# Patient Record
Sex: Male | Born: 1945 | Race: White | Hispanic: No | Marital: Married | Smoking: Never smoker
Health system: Southern US, Academic
[De-identification: ages and names within clinical notes are randomized; demographics above are authoritative.]

## PROBLEM LIST (undated history)

## (undated) DIAGNOSIS — N400 Enlarged prostate without lower urinary tract symptoms: Secondary | ICD-10-CM

## (undated) DIAGNOSIS — Z86718 Personal history of other venous thrombosis and embolism: Secondary | ICD-10-CM

## (undated) DIAGNOSIS — E039 Hypothyroidism, unspecified: Secondary | ICD-10-CM

## (undated) DIAGNOSIS — E782 Mixed hyperlipidemia: Secondary | ICD-10-CM

## (undated) DIAGNOSIS — E119 Type 2 diabetes mellitus without complications: Secondary | ICD-10-CM

## (undated) DIAGNOSIS — R413 Other amnesia: Secondary | ICD-10-CM

## (undated) DIAGNOSIS — K219 Gastro-esophageal reflux disease without esophagitis: Secondary | ICD-10-CM

## (undated) HISTORY — PX: HX HERNIA REPAIR: SHX51

## (undated) HISTORY — PX: HX GALL BLADDER SURGERY/CHOLE: SHX55

---

## 1991-02-04 ENCOUNTER — Inpatient Hospital Stay (HOSPITAL_COMMUNITY): Payer: Self-pay

## 2021-11-28 ENCOUNTER — Encounter (HOSPITAL_COMMUNITY): Payer: Self-pay

## 2021-11-28 ENCOUNTER — Emergency Department (HOSPITAL_COMMUNITY): Payer: 59

## 2021-11-28 ENCOUNTER — Other Ambulatory Visit: Payer: Self-pay

## 2021-11-28 ENCOUNTER — Emergency Department
Admission: EM | Admit: 2021-11-28 | Discharge: 2021-11-28 | Disposition: A | Discharge status: Home / Self Care | Payer: 59 | Attending: Emergency Medicine | Admitting: Emergency Medicine

## 2021-11-28 DIAGNOSIS — Z7901 Long term (current) use of anticoagulants: Secondary | ICD-10-CM | POA: Insufficient documentation

## 2021-11-28 DIAGNOSIS — J189 Pneumonia, unspecified organism: Secondary | ICD-10-CM | POA: Insufficient documentation

## 2021-11-28 HISTORY — DX: Personal history of other venous thrombosis and embolism: Z86.718

## 2021-11-28 HISTORY — DX: Mixed hyperlipidemia: E78.2

## 2021-11-28 HISTORY — DX: Hypothyroidism, unspecified: E03.9

## 2021-11-28 HISTORY — DX: Gastro-esophageal reflux disease without esophagitis: K21.9

## 2021-11-28 HISTORY — DX: Other amnesia: R41.3

## 2021-11-28 HISTORY — DX: Type 2 diabetes mellitus without complications: E11.9

## 2021-11-28 HISTORY — DX: Benign prostatic hyperplasia without lower urinary tract symptoms: N40.0

## 2021-11-28 LAB — URINALYSIS, MACROSCOPIC
BILIRUBIN: NEGATIVE mg/dL
BLOOD: 0.03 mg/dL
GLUCOSE: >1000 mg/dL — AB
KETONES: 100 mg/dL — AB
LEUKOCYTES: NEGATIVE WBCs/uL
NITRITE: NEGATIVE
PH: 5.5 (ref 5.0–9.0)
PROTEIN: 20 mg/dL
SPECIFIC GRAVITY: 1.043 — ABNORMAL HIGH (ref 1.002–1.030)
UROBILINOGEN: NORMAL mg/dL

## 2021-11-28 LAB — CBC WITH DIFF
BASOPHIL #: 0.1 10*3/uL (ref 0.00–0.30)
BASOPHIL %: 1 % (ref 0–3)
EOSINOPHIL #: 0 10*3/uL (ref 0.00–0.80)
EOSINOPHIL %: 0 % (ref 0–7)
HCT: 39.9 % — ABNORMAL LOW (ref 42.0–51.0)
HGB: 13.7 g/dL (ref 13.5–18.0)
LYMPHOCYTE #: 0.9 10*3/uL — ABNORMAL LOW (ref 1.10–5.00)
LYMPHOCYTE %: 8 % — ABNORMAL LOW (ref 25–45)
MCH: 31 pg (ref 27.0–32.0)
MCHC: 34.4 g/dL (ref 32.0–36.0)
MCV: 89.9 fL (ref 78.0–99.0)
MONOCYTE #: 0.9 10*3/uL (ref 0.00–1.30)
MONOCYTE %: 8 % (ref 0–12)
MPV: 7.8 fL (ref 7.4–10.4)
NEUTROPHIL #: 9.6 10*3/uL — ABNORMAL HIGH (ref 1.80–8.40)
NEUTROPHIL %: 84 % — ABNORMAL HIGH (ref 40–76)
PLATELETS: 189 10*3/uL (ref 140–440)
RBC: 4.44 10*6/uL (ref 4.20–6.00)
RDW: 13.9 % (ref 11.6–14.8)
WBC: 11.5 10*3/uL — ABNORMAL HIGH (ref 4.0–10.5)
WBCS UNCORRECTED: 11.5 10*3/uL

## 2021-11-28 LAB — COMPREHENSIVE METABOLIC PANEL, NON-FASTING
ALBUMIN/GLOBULIN RATIO: 1.2 (ref 0.8–1.4)
ALBUMIN: 4 g/dL (ref 3.5–5.7)
ALKALINE PHOSPHATASE: 60 U/L (ref 34–104)
ALT (SGPT): 9 U/L (ref 7–52)
ANION GAP: 13 mmol/L (ref 10–20)
AST (SGOT): 13 U/L (ref 13–39)
BILIRUBIN TOTAL: 1.6 mg/dL — ABNORMAL HIGH (ref 0.3–1.2)
BUN/CREA RATIO: 16 (ref 6–22)
BUN: 17 mg/dL (ref 7–25)
CALCIUM, CORRECTED: 8.7 mg/dL — ABNORMAL LOW (ref 8.9–10.8)
CALCIUM: 8.7 mg/dL (ref 8.6–10.3)
CHLORIDE: 99 mmol/L (ref 98–107)
CO2 TOTAL: 23 mmol/L (ref 21–31)
CREATININE: 1.04 mg/dL (ref 0.60–1.30)
ESTIMATED GFR: 76 mL/min/{1.73_m2} (ref >59)
GLOBULIN: 3.3 (ref 2.9–5.4)
GLUCOSE: 162 mg/dL — ABNORMAL HIGH (ref 74–109)
OSMOLALITY, CALCULATED: 275 mOsm/kg (ref 270–290)
POTASSIUM: 3.7 mmol/L (ref 3.5–5.1)
PROTEIN TOTAL: 7.3 g/dL (ref 6.4–8.9)
SODIUM: 135 mmol/L — ABNORMAL LOW (ref 136–145)

## 2021-11-28 LAB — URINALYSIS, MICROSCOPIC
RBCS: 1 /hpf (ref <4)
WBCS: <1 /hpf (ref <6)

## 2021-11-28 LAB — BLUE TOP TUBE

## 2021-11-28 MED ORDER — DOXYCYCLINE HYCLATE 100 MG TABLET
100.0000 mg | ORAL_TABLET | ORAL | Status: AC
Start: 2021-11-28 — End: 2021-11-28
  Administered 2021-11-28: 100 mg via ORAL

## 2021-11-28 MED ORDER — LIDOCAINE HCL 10 MG/ML (1 %) INJECTION SOLUTION
1.0000 g | INTRAMUSCULAR | Status: AC
Start: 2021-11-28 — End: 2021-11-28
  Administered 2021-11-28: 1 g via INTRAMUSCULAR

## 2021-11-28 MED ORDER — DOXYCYCLINE HYCLATE 100 MG TABLET
ORAL_TABLET | ORAL | Status: AC
Start: 2021-11-28 — End: 2021-11-28
  Filled 2021-11-28: qty 1

## 2021-11-28 MED ORDER — SODIUM CHLORIDE 0.9 % INTRAVENOUS PIGGYBACK
1.0000 g | INTRAVENOUS | Status: DC
Start: 2021-11-28 — End: 2021-11-28

## 2021-11-28 MED ORDER — LIDOCAINE HCL 10 MG/ML (1 %) INJECTION SOLUTION
INTRAMUSCULAR | Status: AC
Start: 2021-11-28 — End: 2021-11-28
  Filled 2021-11-28: qty 50

## 2021-11-28 MED ORDER — AMOXICILLIN 875 MG-POTASSIUM CLAVULANATE 125 MG TABLET
1.0000 | ORAL_TABLET | Freq: Two times a day (BID) | ORAL | 0 refills | Status: AC
Start: 2021-11-28 — End: 2021-12-05

## 2021-11-28 MED ORDER — CEFTRIAXONE 1 GRAM SOLUTION FOR INJECTION
INTRAMUSCULAR | Status: AC
Start: 2021-11-28 — End: 2021-11-28
  Filled 2021-11-28: qty 10

## 2021-11-28 MED ORDER — DOXYCYCLINE HYCLATE 100 MG CAPSULE
100.0000 mg | ORAL_CAPSULE | Freq: Two times a day (BID) | ORAL | 0 refills | Status: AC
Start: 2021-11-28 — End: 2021-12-05

## 2021-11-28 NOTE — ED Triage Notes (Signed)
States since Monday he has been having intermittent confusion and trouble with memory. Headache since yesterday.

## 2021-11-28 NOTE — Discharge Instructions (Signed)

## 2021-11-28 NOTE — ED Provider Notes (Signed)
[facility]  ED Primary Provider Note  Patient Name: Luke Thomas  Patient Age: 76 y.o.  Date of Birth: 1945/08/24    Chief Complaint: Confusion and Headache        History of Present Illness       Luke Thomas is a 76 y.o. male who had concerns including Confusion and Headache.  This patient is a 76 year old male who presents with memory related problems.  The patient hit his head multiple days prior, and this started in the days following hitting his head.  He self endorses having difficulty remembering.  He does take Xarelto.        Review of Systems     No other overt Review of Systems are noted to be positive except noted in the HPI.      Historical Data   History Reviewed This Encounter:        Physical Exam   ED Triage Vitals [11/28/21 1126]   BP (Non-Invasive) (!) 141/85   Heart Rate 92   Respiratory Rate 17   Temperature 36.7 C (98 F)   SpO2 97 %   Weight 110 kg (243 lb)   Height 1.88 m ('6\' 2"' )         Nursing notes reviewed for what could be assessed. Past Medical, Surgical, and Social history reviewed. Exam limited in the setting of personal protective equipment.    Constitutional: NAD. Well-Developed. Well Nourished.  Head: Normocephalic, atraumatic.  Mouth/Throat:  Symmetric facial movement  Eyes: EOM grossly intact, conjunctiva normal.  Neck: Supple  Cardiovascular: Extremities well perfused.  Pulmonary/Chest: No respiratory distress.  No stridor/abnormal respiratory sounds heard.  Abdominal: Non-distended. No overt peritoneal findings.   MSK: No Lower Extremity Edema.  Skin: Warm, dry, and intact for what is visualized.   Neuro: Appropriate, CN II-XII grossly intact. Gait not ataxic.  Psych: Pleasant            Procedures      Patient Data     Labs Ordered/Reviewed   COMPREHENSIVE METABOLIC PANEL, NON-FASTING - Abnormal; Notable for the following components:       Result Value    SODIUM 135 (*)     GLUCOSE 162 (*)     BILIRUBIN TOTAL 1.6 (*)     CALCIUM,  CORRECTED 8.7 (*)     All other components within normal limits    Narrative:     Estimated Glomerular Filtration Rate (eGFR) is calculated using the CKD-EPI (2021) equation, intended for patients 43 years of age and older. If gender is not documented or "unknown", there will be no eGFR calculation.   URINALYSIS, MACROSCOPIC - Abnormal; Notable for the following components:    SPECIFIC GRAVITY 1.043 (*)     GLUCOSE >1000 (*)     KETONES 100 (*)     All other components within normal limits   CBC WITH DIFF - Abnormal; Notable for the following components:    WBC 11.5 (*)     HCT 39.9 (*)     NEUTROPHIL % 84 (*)     LYMPHOCYTE % 8 (*)     NEUTROPHIL # 9.60 (*)     LYMPHOCYTE # 0.90 (*)     All other components within normal limits   URINALYSIS, MICROSCOPIC - Normal   URINALYSIS, MACROSCOPIC AND MICROSCOPIC W/CULTURE REFLEX    Narrative:     The following orders were created for panel order URINALYSIS, MACROSCOPIC AND MICROSCOPIC W/CULTURE REFLEX.  Procedure  Abnormality         Status                     ---------                               -----------         ------                     URINALYSIS, MACROSCOPIC[[id number]]      Abnormal            Final result               URINALYSIS, MICROSCOPIC[[id number]]      Normal              Final result                 Please view results for these tests on the individual orders.   CBC/DIFF    Narrative:     The following orders were created for panel order CBC/DIFF.  Procedure                               Abnormality         Status                     ---------                               -----------         ------                     CBC WITH DIFF[[id number]]                Abnormal            Final result                 Please view results for these tests on the individual orders.   EXTRA TUBES    Narrative:     The following orders were created for panel order EXTRA TUBES.  Procedure                               Abnormality         Status                      ---------                               -----------         ------                     BLUE TOP TUBE[[id number]]                                    Final result                 Please view results for these tests on the individual orders.   BLUE TOP TUBE       XR  CHEST AP AND LATERAL   Final Result by Edi, Radresults In (06/21 1315)   Retrocardiac infiltrate         Radiologist location ID: [id number]         CT BRAIN WO IV CONTRAST   Final Result by Edi, Radresults In (06/21 1215)   NO ACUTE FINDINGS         One or more dose reduction techniques were used (e.g., Automated exposure control, adjustment of the mA and/or kV according to patient size, use of iterative reconstruction technique).         Radiologist location ID: [place] Decision Making          MDM      Studies Assessed:  Lab, radiology      MDM Narrative:  This patient is a 76 year old male who presents with reported memory related problems.  Differential includes intracranial abnormality, infectious etiology, metabolic abnormality.  After workup, the patient was found to have a retrocardiac opacity which can be consistent with pneumonia.  He was given ceftriaxone and doxycycline in the emergency department.  His wife can watch over him, and is in the healthcare field.  They feel safe to be discharged home at this point in time.  Return precautions given.             Medications Administered in the ED   doxycycline tablet (100 mg Oral Given 11/28/21 1402)   cefTRIAXone (ROCEPHIN) 1 g in lidocaine 2.86 mL (tot vol) IM injection (1 g IntraMUSCULAR Given 11/28/21 1401)       Following the history, physical exam, and ED workup, the patient was deemed stable and suitable for discharge. The patient/caregiver was advised to return to the ED for any new or worsening symptoms. Discharge medications, and follow-up instructions were discussed with the patient/caregiver in detail, who verbalizes understanding. The  patient/caregiver is in agreement and is comfortable with the plan of care.    Disposition: Discharged         Current Discharge Medication List      START taking these medications.      Details   amoxicillin-pot clavulanate 875-125 mg Tablet  Commonly known as: AUGMENTIN   1 Tablet, Oral, EVERY 12 HOURS  Qty: 14 Tablet  Refills: 0     doxycycline hyclate 100 mg Capsule  Commonly known as: VIBRAMYCIN   100 mg, Oral, EVERY 12 HOURS  Qty: 14 Capsule  Refills: 0        CONTINUE these medications - NO CHANGES were made during your visit.      Details   donepeziL 5 mg Tablet  Commonly known as: ARICEPT   5 mg, Oral, NIGHTLY  Refills: 0     Farxiga 10 mg Tablet  Generic drug: dapagliflozin propanediol   10 mg, Oral, DAILY  Refills: 0     gabapentin 600 mg Tablet  Commonly known as: NEURONTIN   600 mg, Oral, 3 TIMES DAILY  Refills: 0     levothyroxine 50 mcg Tablet  Commonly known as: SYNTHROID   50 mcg, Oral, EVERY MORNING  Refills: 0     pantoprazole 40 mg Tablet, Delayed Release (E.C.)  Commonly known as: PROTONIX   40 mg, Oral, NIGHTLY  Refills: 0     rivaroxaban 20 mg Tablet  Commonly known as: XARELTO   20 mg, Oral, EVERY MORNING  Refills: 0     rosuvastatin 10 mg Tablet  Commonly known as: CRESTOR   10 mg, Oral, EVERY MORNING  Refills: 0     Rybelsus 7 mg Tablet  Generic drug: semaglutide   7 mg, Oral, DAILY  Refills: 0     tamsulosin 0.4 mg Capsule  Commonly known as: FLOMAX   0.4 mg, Oral, NIGHTLY  Refills: 0          Follow up:   Stroh Haste, MD  [address]  [place] [address]  [phone]    Call today                 Clinical Impression   Pneumonia (Primary)         Current Discharge Medication List      START taking these medications    Details   amoxicillin-pot clavulanate (AUGMENTIN) 875-125 mg Oral Tablet Take 1 Tablet by mouth Every 12 hours for 7 days  Qty: 14 Tablet, Refills: 0      doxycycline hyclate (VIBRAMYCIN) 100 mg Oral Capsule Take 1 Capsule (100 mg total) by mouth Every 12 hours for 7  days  Qty: 14 Capsule, Refills: 0               R. Baldo Daub, MD, [facility]  Department of Emergency Medicine

## 2021-11-28 NOTE — ED Nurses Note (Signed)
Patient discharged home with family.  AVS reviewed with patient/care giver.  A written copy of the AVS and discharge instructions was given to the patient/care giver.  Questions sufficiently answered as needed.  Patient/care giver encouraged to follow up with PCP as indicated.  In the event of an emergency, patient/care giver instructed to call 911 or go to the nearest emergency room.

## 2021-12-20 IMAGING — MR MRI BRAIN W/O CONTRAST
9 series · 48 of 48 positions shown · IV contrast (gadolinium)
Comparison: None available.

﻿EXAM:  MRI BRAIN W/O CONTRAST
INDICATION: Headaches.
TECHNIQUE: Multiplanar, multisequential MRI of the brain was performed without gadolinium contrast.

[Series 5: DWI · axial · 5.0mm · 1.35mm/px · z∈[-60,+66]mm · 16 of 88 slices shown (1 of 3)]
[im 1/88]
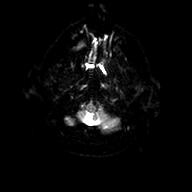
[im 6/88]
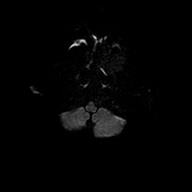
[im 12/88]
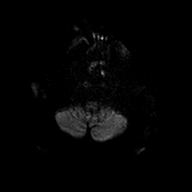
[im 18/88]
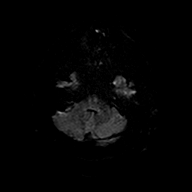
[im 24/88]
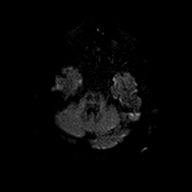
[im 30/88]
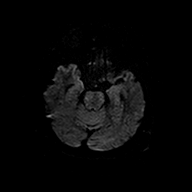
[im 35/88]
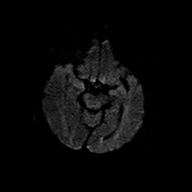
[im 41/88]
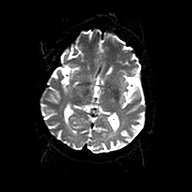
[im 47/88]
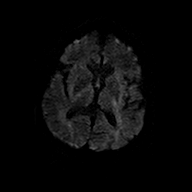
[im 53/88]
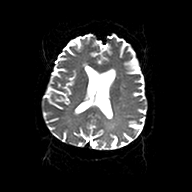
[im 59/88]
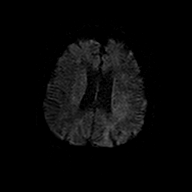
[im 64/88]
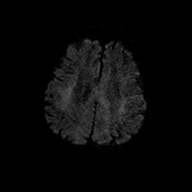
[im 70/88]
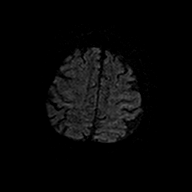
[im 76/88]
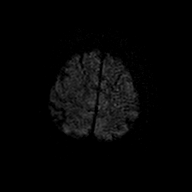
[im 82/88]
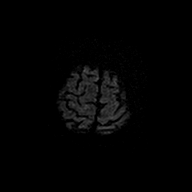
[im 88/88]
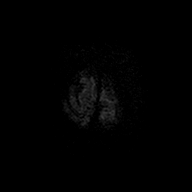

[Series 6: DWI · axial · 5.0mm · 1.35mm/px · z∈[-60,+66]mm · 4 of 22 slices shown (2 of 3)]
[im 1/22]
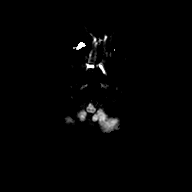
[im 8/22]
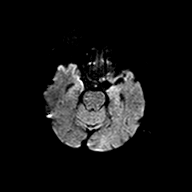
[im 15/22]
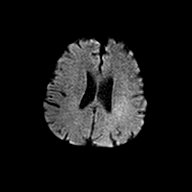
[im 22/22]
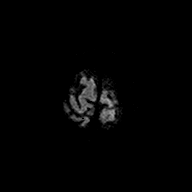

[Series 7: DWI · axial · 5.0mm · 1.35mm/px · z∈[-60,+66]mm · 4 of 22 slices shown (3 of 3)]
[im 1/22]
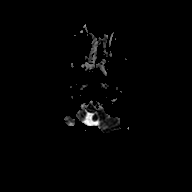
[im 8/22]
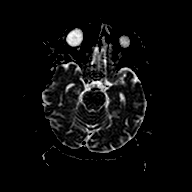
[im 15/22]
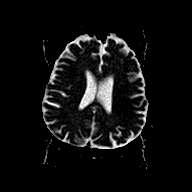
[im 22/22]
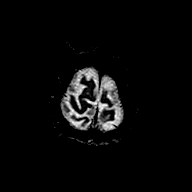

[Series 8: FLAIR · sagittal · 4.0mm · 0.75mm/px · 4 of 26 slices shown (1 of 2)]
[im 1/26]
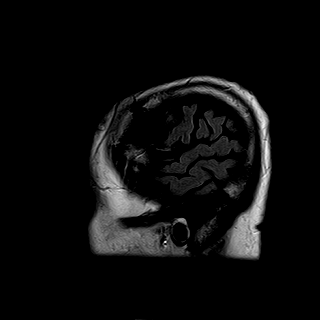
[im 9/26]
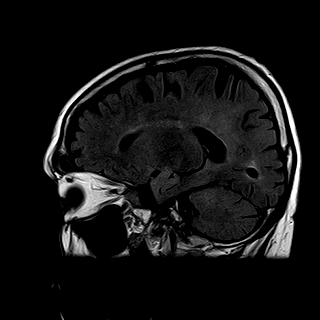
[im 17/26]
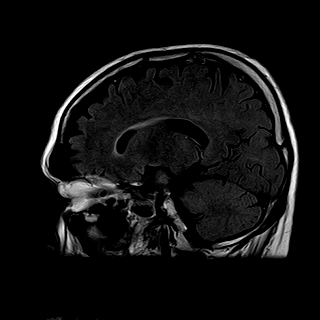
[im 26/26]
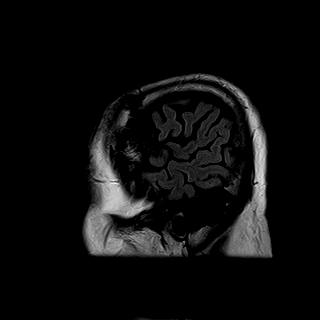

[Series 9: T2 · axial · 5.0mm · 0.43mm/px · z∈[-69,+75]mm · 4 of 25 slices shown (1 of 2)]
[im 1/25]
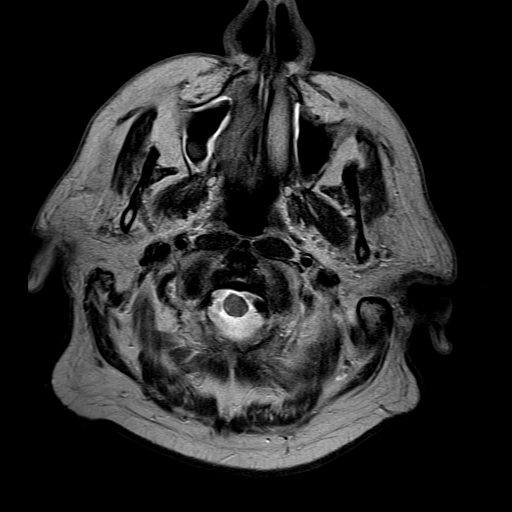
[im 9/25]
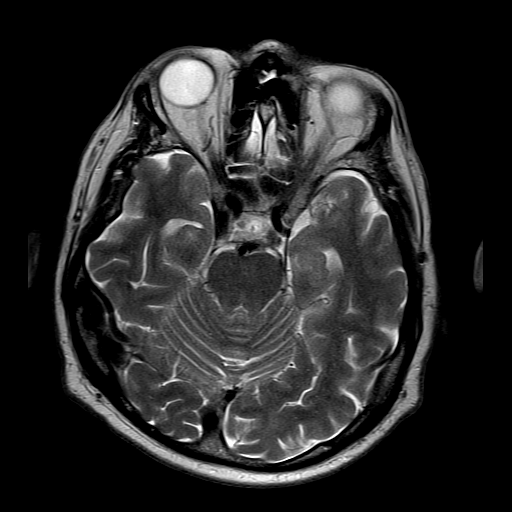
[im 17/25]
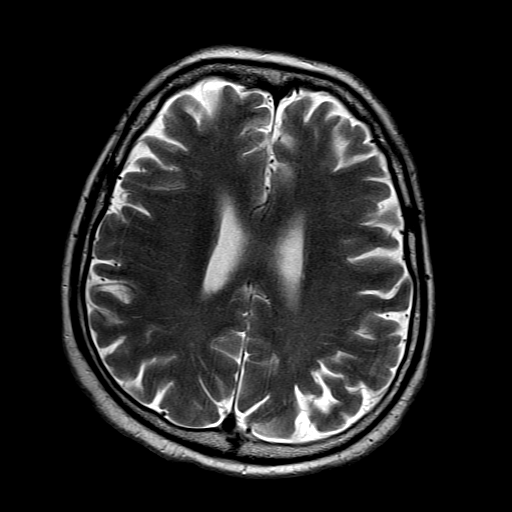
[im 25/25]
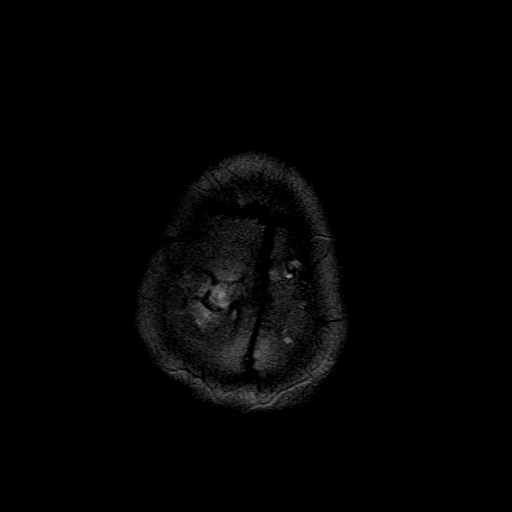

[Series 10: FLAIR · axial · 5.0mm · 0.76mm/px · z∈[-60,+66]mm · 4 of 22 slices shown (2 of 2)]
[im 1/22]
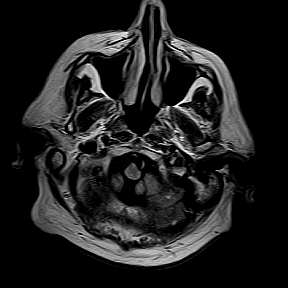
[im 8/22]
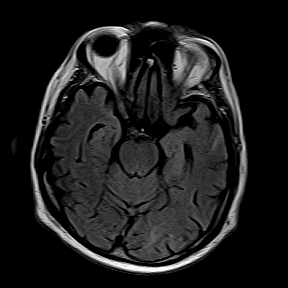
[im 15/22]
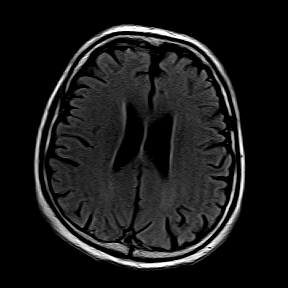
[im 22/22]
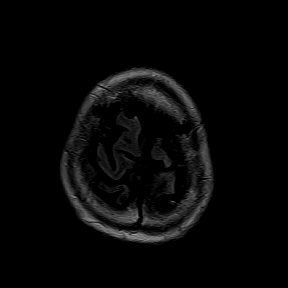

[Series 11: T1 · axial · 5.0mm · 0.69mm/px · z∈[-69,+75]mm · 4 of 25 slices shown]
[im 1/25]
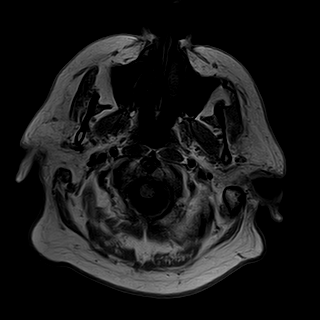
[im 9/25]
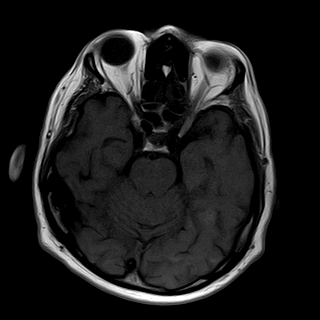
[im 17/25]
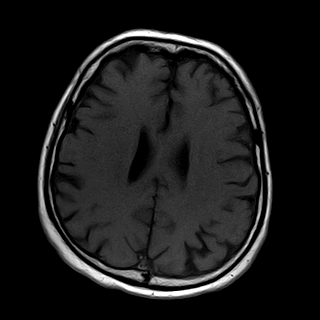
[im 25/25]
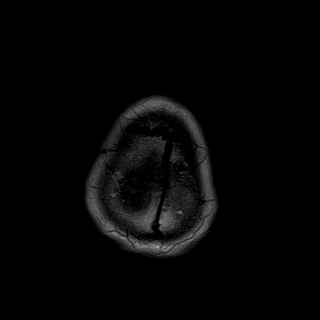

[Series 12: T2-star · axial · 5.0mm · 0.69mm/px · z∈[-69,+75]mm · 4 of 25 slices shown]
[im 1/25]
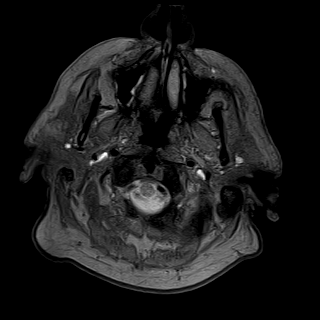
[im 9/25]
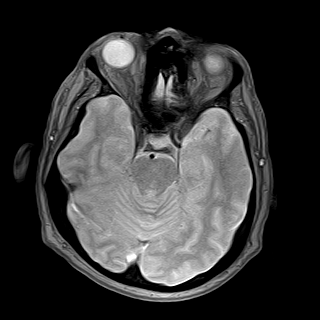
[im 17/25]
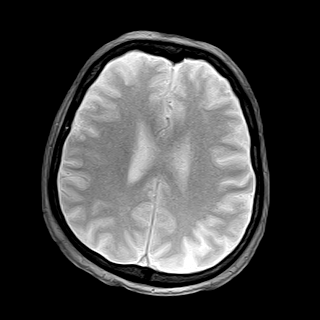
[im 25/25]
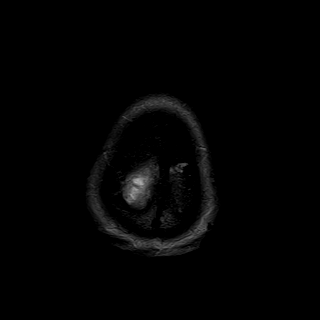

[Series 13: T2 · coronal · 6.0mm · 0.43mm/px · 4 of 24 slices shown (2 of 2)]
[im 1/24]
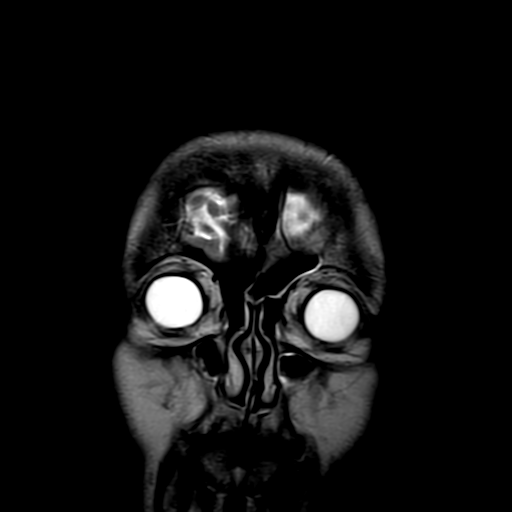
[im 8/24]
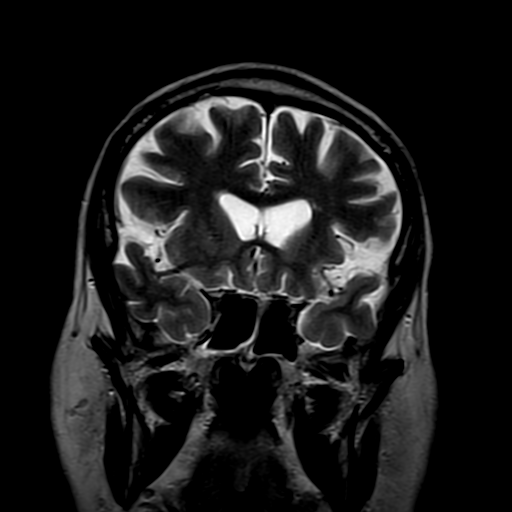
[im 16/24]
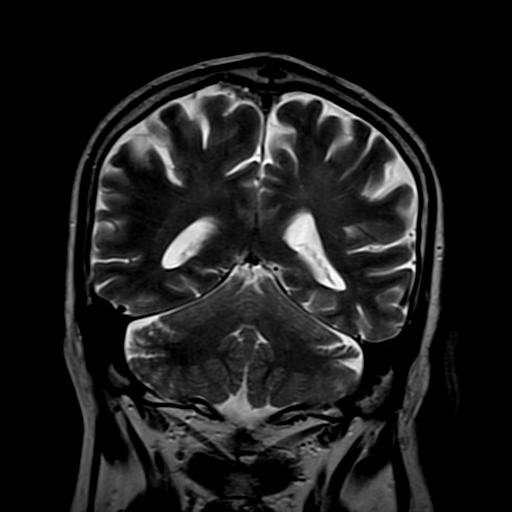
[im 24/24]
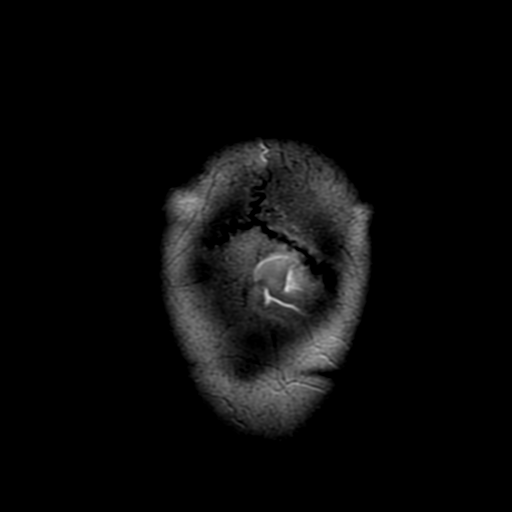

[48 of 48 positions shown; findings below may reference images not displayed]

FINDINGS: Ventricular and sulcal size is normal for the patient's age.  There are no significant chronic ischemic changes.  There is no mass effect, midline shift or intracranial hemorrhage. There is no evidence of acute infarction or prior microhemorrhages. Skull base flow voids and basal cisterns are patent. Sagittal survey of midline structures is unremarkable. There are no extra-axial fluid collections. Visualized paranasal sinuses, mastoid air cells and orbital contents are unremarkable.
IMPRESSION: Unremarkable exam.

## 2022-01-29 IMAGING — MR MRI CERVICAL SPINE WITHOUT CONTRAST
4 of 5 series · 23 of 48 positions shown · IV contrast (gadolinium)
Comparison: MRI head dated 12/20/2021.

﻿EXAM:  [id number]   MRI CERVICAL SPINE WITHOUT CONTRAST
INDICATION: 75-year-old male sustained trauma last month.  Possible cervical myelopathy.
TECHNIQUE: Multiplanar, multisequential MRI of the C-spine was performed without gadolinium contrast.

[Series 5: T2 · sagittal · 3.0mm · 0.75mm/px · 8 of 13 slices shown (1 of 2)]
[im 1/13]
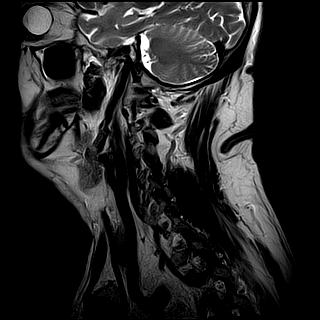
[im 2/13]
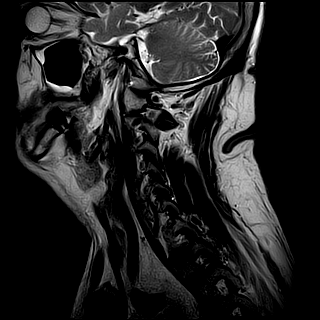
[im 4/13]
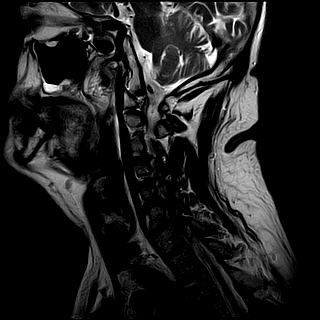
[im 6/13]
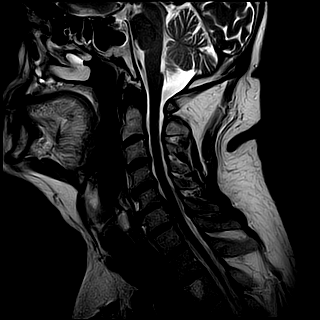
[im 7/13]
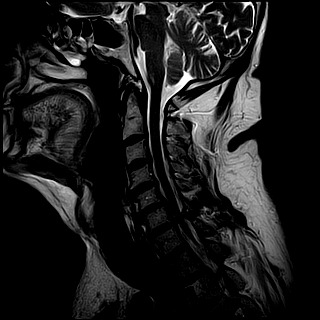
[im 9/13]
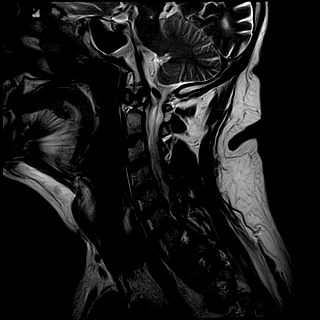
[im 11/13]
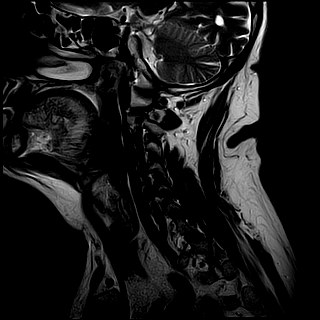
[im 13/13]
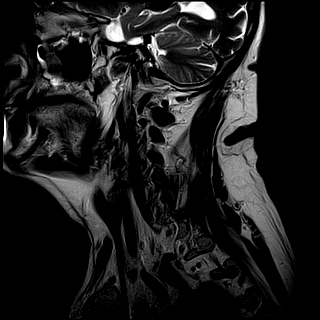

[Series 6: T1 · sagittal · 3.0mm · 0.47mm/px · 3 of 13 slices shown]
[im 2/13]
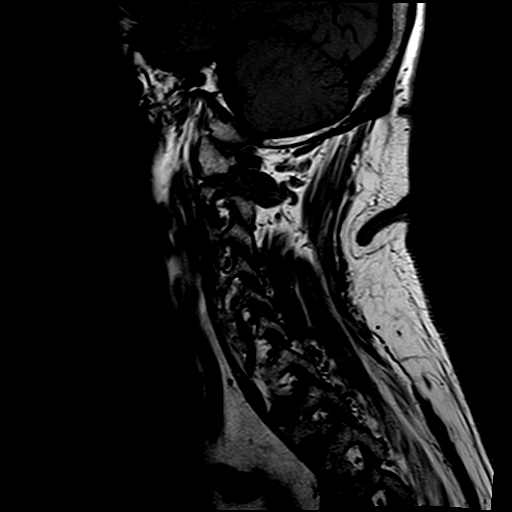
[im 7/13]
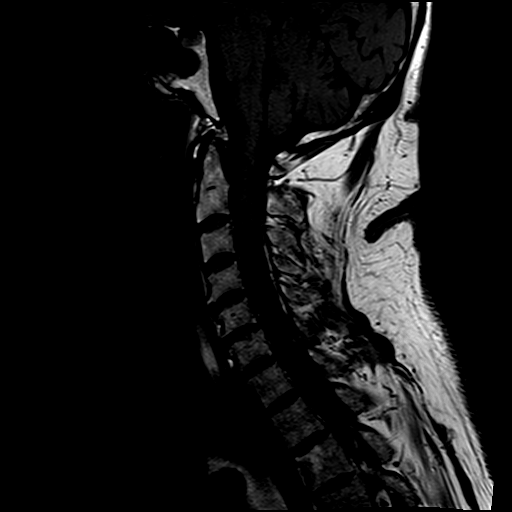
[im 11/13]
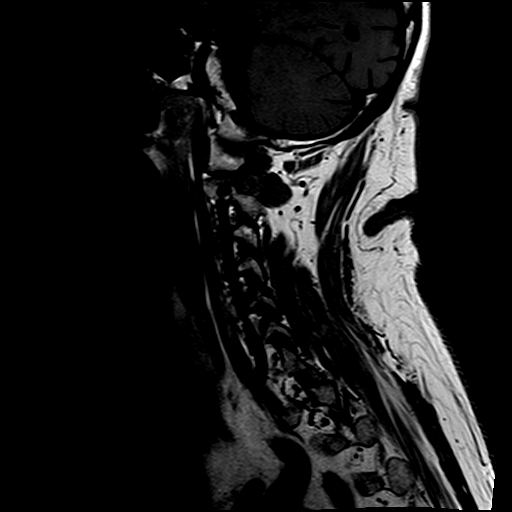

[Series 8: STIR · sagittal · 3.0mm · 0.47mm/px · 3 of 13 slices shown]
[im 2/13]
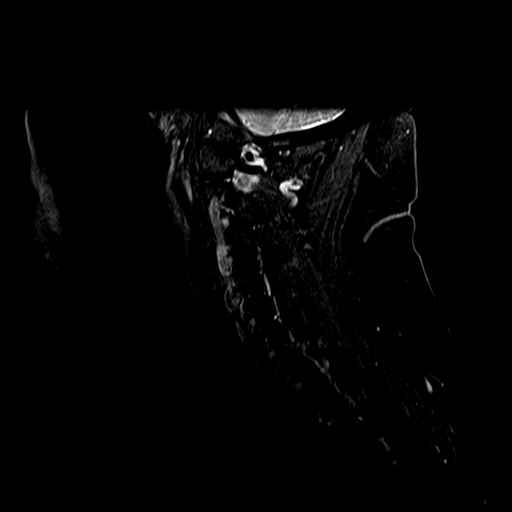
[im 7/13]
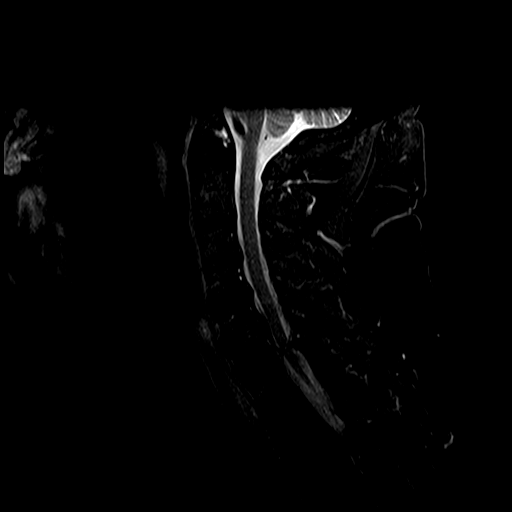
[im 11/13]
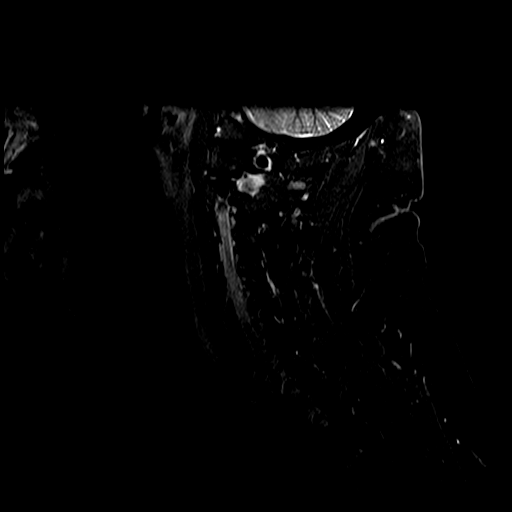

[Series 9: T2 · axial · 3.0mm · 0.39mm/px · z∈[-132,-23]mm · 9 of 18 slices shown (2 of 2)]
[im 1/18]
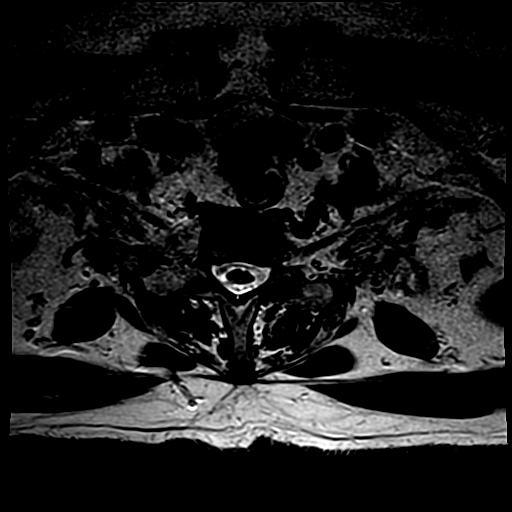
[im 4/18]
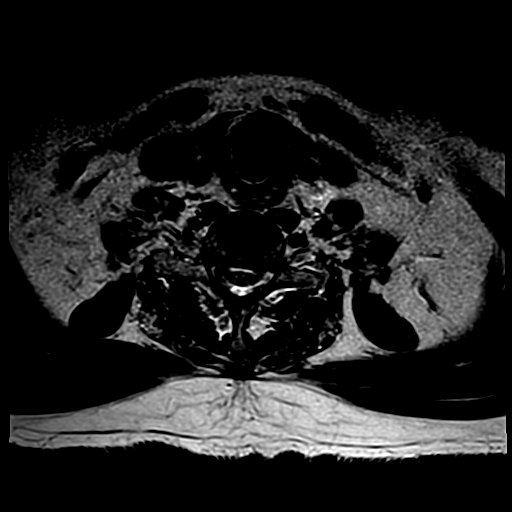
[im 5/18]
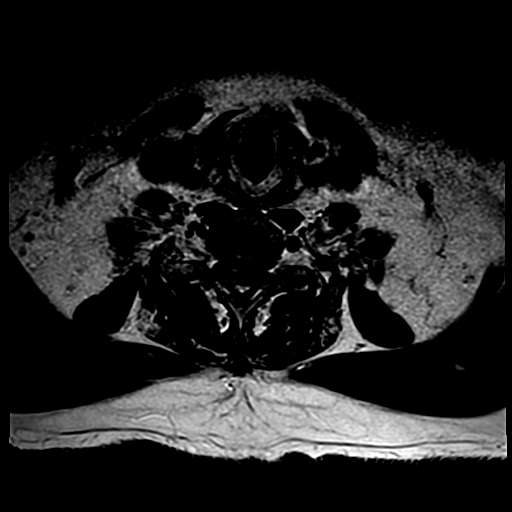
[im 8/18]
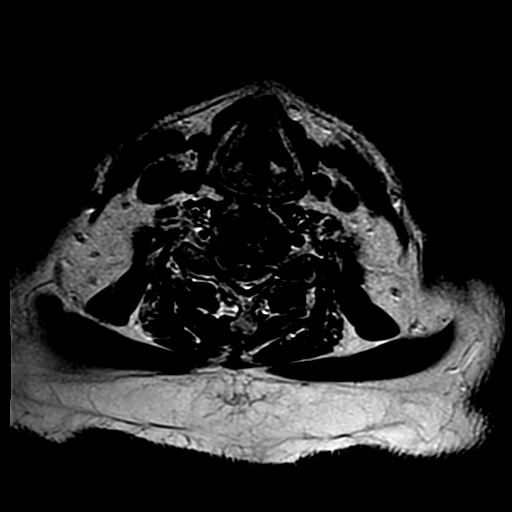
[im 10/18]
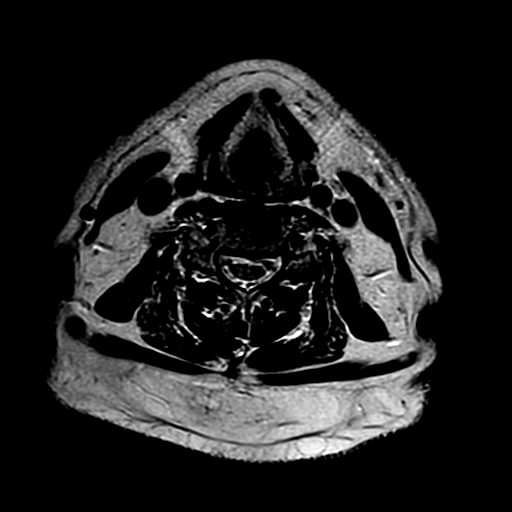
[im 13/18]
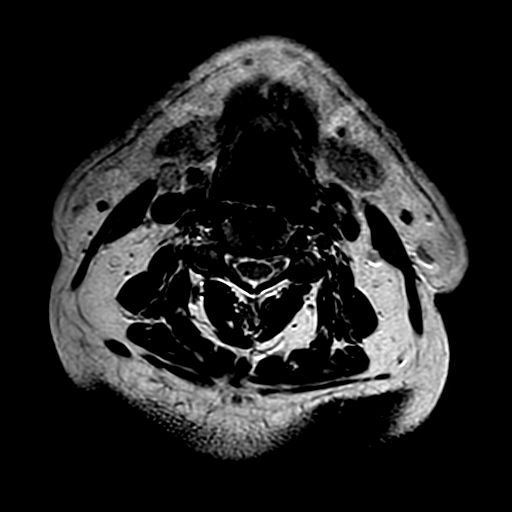
[im 14/18]
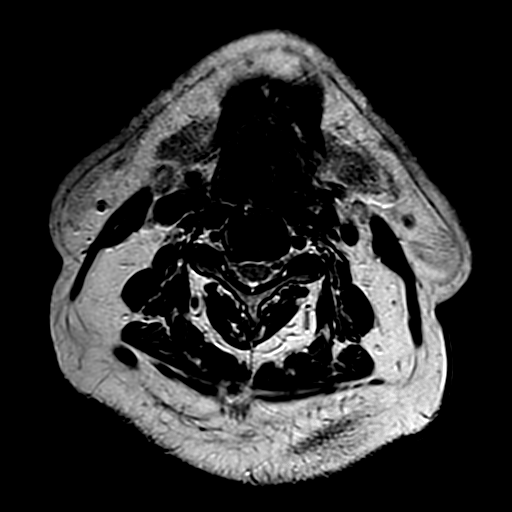
[im 16/18]
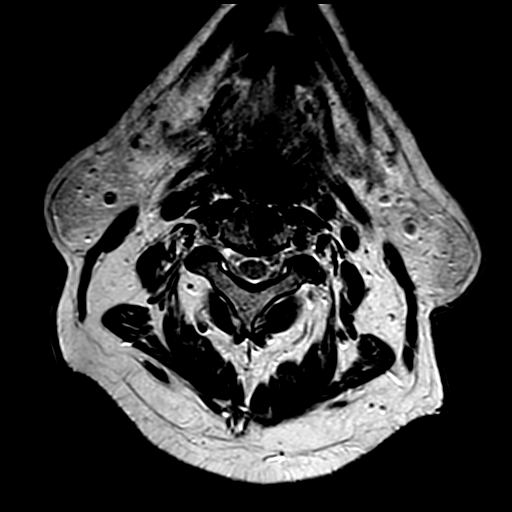
[im 18/18]
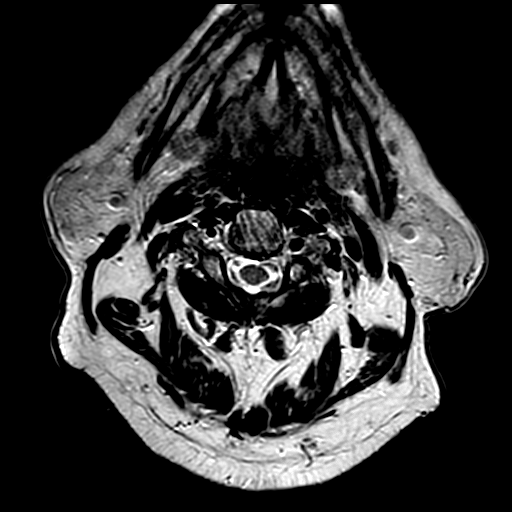

[23 of 48 positions shown; findings below may reference images not displayed]

FINDINGS: No acute bony lesions of cervical vertebrae are seen. 

Posterior fossa and foramen magnum structures do not show any focal abnormalities in the sagittal images. 

At C2-3 level, no focal disc lesions are seen. 

At C3-4 level, degenerative disc disease with osteophyte complex causing mild compromise of right lateral recess and neural foramen.

At C4-5 level, degenerative disc disease causing mild compromise of right neural foramen due to bulging annulus as well as thecal sac in the midline. 

At C5-6 level, degenerative disc disease with bulging annulus is noted causing mild compromise of thecal sac and moderate compromise of both neural foramina, left more than the right. AP diameter of the thecal sac in the midline measures 10 mm.

At C6-7 level, significant degenerative disc disease is noted with hypertrophy of dorsal ligaments. Bulging annulus and hypertrophy of dorsal ligaments are noted.  The annulus bulge is asymmetric to the left with annulus tear on the left side lateral recess.  Significant compromise of left lateral recess and neural foramen are noted at this level. AP diameter of thecal sac in the midline measures 7.3 mm.

C7-T1 disc shows no focal pathology.

Cervical spinal cord shows no focal abnormal signal or syrinx formation, etcetera.
IMPRESSION: 1. No acute bone changes of cervical vertebrae. 

2. At C5-6 level, degenerative disc disease with bulging annulus is noted causing mild compromise of thecal sac and moderate compromise of both neural foramina, left more than the right. AP diameter of the thecal sac in the midline measures 10 mm.

3. At C6-7 level, significant degenerative disc disease is noted with hypertrophy of dorsal ligaments. Bulging annulus and hypertrophy of dorsal ligaments are noted.  The annulus bulge is asymmetric to the left with annulus tear on the left side lateral recess.  Significant compromise of left lateral recess and neural foramen are noted at this level. AP diameter of thecal sac in the midline measures 7.3 mm.

4. No focal signal abnormality or other lesions of cervical spinal cord are seen.

## 2022-09-02 ENCOUNTER — Other Ambulatory Visit (HOSPITAL_COMMUNITY): Payer: Self-pay | Admitting: GENERAL SURGERY

## 2022-09-02 DIAGNOSIS — K862 Cyst of pancreas: Secondary | ICD-10-CM

## 2022-09-10 ENCOUNTER — Ambulatory Visit (HOSPITAL_COMMUNITY): Payer: Self-pay

## 2022-09-10 ENCOUNTER — Other Ambulatory Visit: Payer: Self-pay

## 2022-09-10 ENCOUNTER — Inpatient Hospital Stay (HOSPITAL_COMMUNITY)
Admission: RE | Admit: 2022-09-10 | Discharge: 2022-09-10 | Disposition: A | Discharge status: Home / Self Care | Payer: 59 | Source: Ambulatory Visit | Attending: GENERAL SURGERY | Admitting: GENERAL SURGERY

## 2022-09-10 ENCOUNTER — Inpatient Hospital Stay
Admission: RE | Admit: 2022-09-10 | Discharge: 2022-09-10 | Disposition: A | Discharge status: Home / Self Care | Payer: 59 | Source: Ambulatory Visit | Attending: GENERAL SURGERY | Admitting: GENERAL SURGERY

## 2022-09-10 DIAGNOSIS — K862 Cyst of pancreas: Secondary | ICD-10-CM | POA: Insufficient documentation

## 2022-09-10 MED ORDER — GADOBUTROL 10 MMOL/10 ML (1 MMOL/ML) INTRAVENOUS SOLUTION
10.0000 mL | INTRAVENOUS | Status: AC
Start: 2022-09-10 — End: 2022-09-10
  Administered 2022-09-10: 10 mL via INTRAVENOUS

## 2022-09-21 ENCOUNTER — Other Ambulatory Visit: Payer: Self-pay

## 2022-09-21 ENCOUNTER — Emergency Department
Admission: EM | Admit: 2022-09-21 | Discharge: 2022-09-21 | Disposition: A | Discharge status: Home / Self Care | Payer: 59 | Attending: Physician Assistant | Admitting: Physician Assistant

## 2022-09-21 ENCOUNTER — Encounter (HOSPITAL_COMMUNITY): Payer: Self-pay

## 2022-09-21 DIAGNOSIS — K863 Pseudocyst of pancreas: Secondary | ICD-10-CM | POA: Insufficient documentation

## 2022-09-21 LAB — COMPREHENSIVE METABOLIC PANEL, NON-FASTING
ALBUMIN/GLOBULIN RATIO: 1.4 (ref 0.8–1.4)
ALBUMIN: 4.1 g/dL (ref 3.5–5.7)
ALKALINE PHOSPHATASE: 70 U/L (ref 34–104)
ALT (SGPT): 14 U/L (ref 7–52)
ANION GAP: 8 mmol/L (ref 4–13)
AST (SGOT): 12 U/L — ABNORMAL LOW (ref 13–39)
BILIRUBIN TOTAL: 0.9 mg/dL (ref 0.3–1.2)
BUN/CREA RATIO: 17 (ref 6–22)
BUN: 15 mg/dL (ref 7–25)
CALCIUM, CORRECTED: 8.8 mg/dL — ABNORMAL LOW (ref 8.9–10.8)
CALCIUM: 8.9 mg/dL (ref 8.6–10.3)
CHLORIDE: 102 mmol/L (ref 98–107)
CO2 TOTAL: 28 mmol/L (ref 21–31)
CREATININE: 0.89 mg/dL (ref 0.60–1.30)
ESTIMATED GFR: 90 mL/min/{1.73_m2} (ref >59)
GLOBULIN: 3 (ref 2.9–5.4)
GLUCOSE: 194 mg/dL — ABNORMAL HIGH (ref 74–109)
OSMOLALITY, CALCULATED: 282 mOsm/kg (ref 270–290)
POTASSIUM: 4.1 mmol/L (ref 3.5–5.1)
PROTEIN TOTAL: 7.1 g/dL (ref 6.4–8.9)
SODIUM: 138 mmol/L (ref 136–145)

## 2022-09-21 LAB — LIPASE: LIPASE: 105 U/L — ABNORMAL HIGH (ref 11–82)

## 2022-09-21 LAB — CBC WITH DIFF
BASOPHIL #: 0.1 10*3/uL (ref 0.00–0.10)
BASOPHIL %: 1 % (ref 0–1)
EOSINOPHIL #: 0.2 10*3/uL (ref 0.00–0.50)
EOSINOPHIL %: 3 %
HCT: 38.7 % (ref 36.7–47.1)
HGB: 13.5 g/dL (ref 12.5–16.3)
LYMPHOCYTE #: 1.5 10*3/uL (ref 1.00–3.00)
LYMPHOCYTE %: 20 % (ref 16–44)
MCH: 31.1 pg (ref 23.8–33.4)
MCHC: 34.9 g/dL (ref 32.5–36.3)
MCV: 89.1 fL (ref 73.0–96.2)
MONOCYTE #: 0.6 10*3/uL (ref 0.30–1.00)
MONOCYTE %: 8 % (ref 5–13)
MPV: 7.9 fL (ref 7.4–11.4)
NEUTROPHIL #: 5.3 10*3/uL (ref 1.85–7.80)
NEUTROPHIL %: 69 % (ref 43–77)
PLATELETS: 167 10*3/uL (ref 140–440)
RBC: 4.35 10*6/uL (ref 4.06–5.63)
RDW: 14.1 % (ref 12.1–16.2)
WBC: 7.7 10*3/uL (ref 3.6–10.2)

## 2022-09-21 LAB — BLUE TOP TUBE

## 2022-09-21 LAB — GOLD TOP TUBE

## 2022-09-21 LAB — GRAY TOP TUBE

## 2022-09-21 NOTE — ED Provider Notes (Signed)
Emergency Medicine      Name: Luke Thomas  Age and Gender: 77 y.o. male  Date of Birth: Jan 16, 1946  MRN: [id number]  PCP: Jodell Cipro, MD    CC:  Chief Complaint   Patient presents with    Abnormal Lab Result       HPI:  Luke Thomas is a 77 y.o. White male who presents to the ER with RUQ discomfort and known pancreatic pseudocyst. Patient states he has had prior pseudocyst removed by Dr Theodoro Kalata, and developed discomfort in the RUQ about a month ago. He was seen by Dr Theodoro Kalata and had an abdominal MRI on 4/2 showing a cystic lesion anterior to the neck of the pancreas with mild septation. No definite enhancing suspicious features. There is a small pseudocyst in this area and 2018, this appears likely to be enlargement of the pseudocyst. Short-term follow-up MRI in 6 months recommended to reassess this area.  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 Similar to the prior exam. Questionable mild duodenitis pattern, consider endoscopy if not recently done.    Patient states his discomfort has continued to worsen, but he denies nausea, vomiting, fever. He states Dr Theodoro Kalata was supposed to refer him to another surgeon for laparoscopic drainage of the pseudocyst but he is becoming impatient so he came to the ER for evaluation.    Below pertinent information reviewed with patient:  Past Medical History:   Diagnosis Date    BPH (benign prostatic hyperplasia)     Diabetes mellitus, type 2 (CMS HCC)     GERD (gastroesophageal reflux disease)     History of DVT (deep vein thrombosis)     Hypothyroidism     Memory deficit     Mixed hyperlipidemia          No Known Allergies    Past Surgical History:   Procedure Laterality Date    HX CHOLECYSTECTOMY      HX HERNIA REPAIR           Social History     Socioeconomic History    Marital status: Married   Tobacco Use    Smoking status: Never    Smokeless tobacco: Never   Substance and Sexual Activity    Alcohol use: Never    Drug use:  Never    Sexual activity: Never       ROS:  No other overt positive review of systems are noted other than stated in the HPI.      Objective:    ED Triage Vitals [09/21/22 1422]   BP (Non-Invasive) (!) 149/89   Heart Rate 74   Respiratory Rate 18   Temperature 36 C (96.8 F)   SpO2 99 %   Weight 101 kg (222 lb)   Height 1.88 m ( )     Filed Vitals:    09/21/22 1422   BP: (!) 149/89   Pulse: 74   Resp: 18   Temp: 36 C (96.8 F)   SpO2: 99%       Nursing notes and vital signs reviewed.    Constitutional - No acute distress.  Alert and Active.  HEENT - Normocephalic. Conjunctiva clear.  Moist mucous membranes.   Neck - Trachea midline. No stridor. No hoarseness.  Cardiac - Regular rate and rhythm. No murmurs, rubs, or gallops.   Respiratory/Chest - Normal respiratory effort. Clear to auscultation bilaterally. No rales, wheezes or rhonchi.   Abdomen - Normal bowel sounds.  Mild tenderness in the RUQ, soft, non-distended. No rebound or guarding.   Musculoskeletal - Good AROM. No clubbing, cyanosis or edema.  Skin - Warm and dry, without any rashes or other lesions.  Neuro - Alert and oriented x 3.  Moving all extremities symmetrically.   Psych - Normal mood and affect. Behavior is normal       Any pertinent labs and imaging obtained during this encounter reviewed below in MDM.    MDM/ED Course:      Medical Decision Making  Patient with a past history of pancreatic pseudocyst 30 years ago presents to the ER with complaints of right upper quadrant discomfort which has been progressing over the past month.  He states he was seen by Dr. Theodoro Kalata who ordered an abdominal MRI which was performed on April 2nd showing a recurrent pseudocyst which was about 6 x 6 x 6 cm. Patient states his discomfort has continued to worsen, but he denies nausea, vomiting, fever. He states Dr Theodoro Kalata was supposed to refer him to another surgeon for laparoscopic drainage of the pseudocyst but he is becoming impatient so he came to the ER for  evaluation.  As the patient had recently had abdominal imaging, I chose not to repeat this.  I did add all labs which are as noted in ED course.  Case was discussed with Dr. Concha Se who recommended having the patient come to his office Monday morning at 9:00 a.m.Marland Kitchen  Copy of the abdominal MRI was faxed to Dr. Concha Se is office.  Patient will be discharged in stable condition at this time    Amount and/or Complexity of Data Reviewed  Labs: ordered. Decision-making details documented in ED Course.  ECG/medicine tests: independent interpretation performed.          ED Course as of 09/21/22 1907   Sat Sep 21, 2022   1847 LIPASE(!): 105   1848 CBC/DIFF  normal   1848 COMPREHENSIVE METABOLIC PANEL, NON-FASTING(!)  Unremarkable other than glucose 194   1857 Discussed case with Dr Concha Se who stated the patient would likely need an endoscopic ultrasound guided drainage of the pseudocyst but he could come to the office Monday morning at 9am and they would set that up for him       Orders Placed This Encounter    COMPREHENSIVE METABOLIC PANEL, NON-FASTING    CBC/DIFF    LIPASE    CBC WITH DIFF    EXTRA TUBES    BLUE TOP TUBE    GOLD TOP TUBE    GRAY TOP TUBE           Impression:   Clinical Impression   Pseudocyst of pancreas (Primary)       Disposition: Discharged    / Maryagnes Amos PA-C  09/21/2022, 17:11  [facility]  Department of Emergency Medicine  [facility]    Portions of this note may have been dictated using voice recognition software.     -----------------------  Results for orders placed or performed during the hospital encounter of 09/21/22 (from the past 12 hour(s))   COMPREHENSIVE METABOLIC PANEL, NON-FASTING   Result Value Ref Range    SODIUM 138 136 - 145 mmol/L    POTASSIUM 4.1 3.5 - 5.1 mmol/L    CHLORIDE 102 98 - 107 mmol/L    CO2 TOTAL 28 21 - 31 mmol/L    ANION GAP 8 4 - 13 mmol/L    BUN 15 7 - 25 mg/dL  CREATININE 0.89 0.60 - 1.30 mg/dL    BUN/CREA RATIO 17 6 - 22     ESTIMATED GFR 90 >59 mL/min/1.67m^2    ALBUMIN 4.1 3.5 - 5.7 g/dL    CALCIUM 8.9 8.6 - 16.1 mg/dL    GLUCOSE 096 (H) 74 - 109 mg/dL    ALKALINE PHOSPHATASE 70 34 - 104 U/L    ALT (SGPT) 14 7 - 52 U/L    AST (SGOT) 12 (L) 13 - 39 U/L    BILIRUBIN TOTAL 0.9 0.3 - 1.2 mg/dL    PROTEIN TOTAL 7.1 6.4 - 8.9 g/dL    ALBUMIN/GLOBULIN RATIO 1.4 0.8 - 1.4    OSMOLALITY, CALCULATED 282 270 - 290 mOsm/kg    CALCIUM, CORRECTED 8.8 (L) 8.9 - 10.8 mg/dL    GLOBULIN 3.0 2.9 - 5.4   LIPASE   Result Value Ref Range    LIPASE 105 (H) 11 - 82 U/L   CBC WITH DIFF   Result Value Ref Range    WBC 7.7 3.6 - 10.2 x10^3/uL    RBC 4.35 4.06 - 5.63 x10^6/uL    HGB 13.5 12.5 - 16.3 g/dL    HCT 04.5 40.9 - 81.1 %    MCV 89.1 73.0 - 96.2 fL    MCH 31.1 23.8 - 33.4 pg    MCHC 34.9 32.5 - 36.3 g/dL    RDW 91.4 78.2 - 95.6 %    PLATELETS 167 140 - 440 x10^3/uL    MPV 7.9 7.4 - 11.4 fL    NEUTROPHIL % 69 43 - 77 %    LYMPHOCYTE % 20 16 - 44 %    MONOCYTE % 8 5 - 13 %    EOSINOPHIL % 3 %    BASOPHIL % 1 0 - 1 %    NEUTROPHIL # 5.30 1.85 - 7.80 x10^3/uL    LYMPHOCYTE # 1.50 1.00 - 3.00 x10^3/uL    MONOCYTE # 0.60 0.30 - 1.00 x10^3/uL    EOSINOPHIL # 0.20 0.00 - 0.50 x10^3/uL    BASOPHIL # 0.10 0.00 - 0.10 x10^3/uL     No orders to display

## 2022-09-21 NOTE — Discharge Instructions (Signed)
Follow-up with Dr. Concha Se in his office Monday morning at 9:00 a.m..  He will set you up for drainage of your pseudocyst.  Return to the ER for other emergencies or as needed.

## 2022-09-21 NOTE — ED Nurses Note (Signed)
Patient assigned to ER room 25. Patient here today with c/o of swelling with a cyst to his right side x one month. Patient states that he is "making no progress" with his PCP. Patient states that the "pain is lasting longer and is worse". Patient states that he had a MRI of the area on 2nd of this month.

## 2022-09-21 NOTE — ED Triage Notes (Signed)
Cyst to rt side x1 month with pain and swelling.

## 2022-09-21 NOTE — ED Nurses Note (Signed)
Patient discharged home with family.  AVS reviewed with patient/care giver.  A written copy of the AVS and discharge instructions was given to the patient/care giver.  Questions sufficiently answered as needed.  Patient/care giver encouraged to follow up with PCP as indicated.  In the event of an emergency, patient/care giver instructed to call 911 or go to the nearest emergency room.

## 2023-04-23 ENCOUNTER — Encounter (HOSPITAL_COMMUNITY): Payer: Self-pay

## 2023-09-30 ENCOUNTER — Other Ambulatory Visit (HOSPITAL_COMMUNITY): Payer: Self-pay | Admitting: GENERAL SURGERY

## 2023-09-30 DIAGNOSIS — R42 Dizziness and giddiness: Secondary | ICD-10-CM

## 2023-10-10 ENCOUNTER — Other Ambulatory Visit: Payer: Self-pay

## 2023-10-10 ENCOUNTER — Emergency Department
Admission: EM | Admit: 2023-10-10 | Discharge: 2023-10-10 | Disposition: A | Discharge status: Home / Self Care | Source: Home / Self Care | Attending: Emergency Medicine | Admitting: Emergency Medicine

## 2023-10-10 ENCOUNTER — Emergency Department (HOSPITAL_COMMUNITY)

## 2023-10-10 ENCOUNTER — Encounter (HOSPITAL_COMMUNITY): Payer: Self-pay | Admitting: Emergency Medicine

## 2023-10-10 DIAGNOSIS — R519 Headache, unspecified: Secondary | ICD-10-CM | POA: Insufficient documentation

## 2023-10-10 DIAGNOSIS — I498 Other specified cardiac arrhythmias: Secondary | ICD-10-CM | POA: Insufficient documentation

## 2023-10-10 DIAGNOSIS — R42 Dizziness and giddiness: Secondary | ICD-10-CM

## 2023-10-10 DIAGNOSIS — R9431 Abnormal electrocardiogram [ECG] [EKG]: Secondary | ICD-10-CM | POA: Insufficient documentation

## 2023-10-10 DIAGNOSIS — J329 Chronic sinusitis, unspecified: Secondary | ICD-10-CM | POA: Insufficient documentation

## 2023-10-10 LAB — COMPREHENSIVE METABOLIC PANEL, NON-FASTING
ALBUMIN/GLOBULIN RATIO: 1.6 — ABNORMAL HIGH (ref 0.8–1.4)
ALBUMIN: 4.1 g/dL (ref 3.5–5.7)
ALKALINE PHOSPHATASE: 47 U/L (ref 34–104)
ALT (SGPT): 20 U/L (ref 7–52)
ANION GAP: 6 mmol/L (ref 4–13)
AST (SGOT): 21 U/L (ref 13–39)
BILIRUBIN TOTAL: 0.8 mg/dL (ref 0.3–1.0)
BUN/CREA RATIO: 12 (ref 6–22)
BUN: 15 mg/dL (ref 7–25)
CALCIUM, CORRECTED: 8.6 mg/dL — ABNORMAL LOW (ref 8.9–10.8)
CALCIUM: 8.7 mg/dL (ref 8.6–10.3)
CHLORIDE: 103 mmol/L (ref 98–107)
CO2 TOTAL: 28 mmol/L (ref 21–31)
CREATININE: 1.22 mg/dL (ref 0.60–1.30)
ESTIMATED GFR: 62 mL/min/{1.73_m2} (ref >59)
GLOBULIN: 2.6 (ref 2.0–3.5)
GLUCOSE: 273 mg/dL — ABNORMAL HIGH (ref 74–109)
OSMOLALITY, CALCULATED: 285 mosm/kg (ref 270–290)
POTASSIUM: 4.3 mmol/L (ref 3.5–5.1)
PROTEIN TOTAL: 6.7 g/dL (ref 6.4–8.9)
SODIUM: 137 mmol/L (ref 136–145)

## 2023-10-10 LAB — CBC WITH DIFF
BASOPHIL #: 0.1 10*3/uL (ref 0.00–0.10)
BASOPHIL %: 1 % (ref 0–1)
EOSINOPHIL #: 0.2 10*3/uL (ref 0.00–0.50)
EOSINOPHIL %: 5 % (ref 1–8)
HCT: 38.4 % (ref 36.7–47.1)
HGB: 13.7 g/dL (ref 12.5–16.3)
LYMPHOCYTE #: 1.4 10*3/uL (ref 1.00–3.20)
LYMPHOCYTE %: 30 % (ref 15–43)
MCH: 32.4 pg (ref 23.8–33.4)
MCHC: 35.6 g/dL (ref 32.5–36.3)
MCV: 90.9 fL (ref 73.0–96.2)
MONOCYTE #: 0.4 10*3/uL (ref 0.30–1.10)
MONOCYTE %: 7 % (ref 6–14)
MPV: 8 fL (ref 7.4–11.4)
NEUTROPHIL #: 2.8 10*3/uL (ref 1.70–7.60)
NEUTROPHIL %: 57 % (ref 44–74)
PLATELETS: 166 10*3/uL (ref 140–440)
RBC: 4.23 10*6/uL (ref 4.06–5.63)
RDW: 14.3 % (ref 12.1–16.2)
WBC: 4.9 10*3/uL (ref 3.6–10.2)

## 2023-10-10 LAB — GRAY TOP TUBE

## 2023-10-10 LAB — MAGNESIUM: MAGNESIUM: 2.2 mg/dL (ref 1.9–2.7)

## 2023-10-10 LAB — BLUE TOP TUBE

## 2023-10-10 MED ORDER — PREDNISONE 10 MG TABLET
30.0000 mg | ORAL_TABLET | Freq: Every day | ORAL | 0 refills | Status: AC
Start: 2023-10-10 — End: 2023-10-15

## 2023-10-10 MED ORDER — AMOXICILLIN 875 MG TABLET
875.0000 mg | ORAL_TABLET | Freq: Two times a day (BID) | ORAL | 0 refills | Status: AC
Start: 2023-10-10 — End: 2023-10-20

## 2023-10-10 NOTE — Discharge Instructions (Addendum)
 Amoxicillin  875 one tablet twice daily times 10 days  Prednisone  30 mg daily x5 days.      Follow up with primary care.      Return to emergency department if worse and as needed.

## 2023-10-10 NOTE — ED Nurses Note (Signed)
 Pt to ED 12 via triage. Pt states that he has been having headaches for a mon-

## 2023-10-10 NOTE — ED Nurses Note (Incomplete)
 Pt to ED 12 via triage. Pt states that he has been having headaches for a month along with dizziness on and off.

## 2023-10-10 NOTE — ED Nurses Note (Signed)
 Pt given verbal and written discharge instructions, verbalized understanding.

## 2023-10-10 NOTE — ED Provider Notes (Signed)
 [facility] - Emergency Department  ED Primary Provider Note  History of Present Illness   Chief Complaint   Patient presents with    Headache     Arrival: The patient arrived by Car  Luke Thomas is a 78 y.o. male who had concerns including Headache.  The patient complains of intermittent frontal headaches over the past month.  The patient states headaches are severe.  He states he will have to take a nap when he has a headaches.  He states the pain will resolve while he is sleeping.  He states the pain then returns sometime later.  Headaches are nearly daily.    History Reviewed This Encounter: Medical History  Surgical History  Family History  Social History    Physical Exam   ED Triage Vitals [10/10/23 1812]   BP (Non-Invasive) (!) 146/62   Heart Rate 64   Respiratory Rate 18   Temperature 36.3 C (97.3 F)   SpO2 98 %   Weight 102 kg (225 lb)   Height 1.88 m (6\' 2" )       Constitutional:  78 y.o. male who appears in no distress. Normal color, no cyanosis.   HENT:   Head: Normocephalic and atraumatic.   Mouth/Throat: Oropharynx is clear and moist.   Eyes: EOMI, PERRL   Neck: Trachea midline. Neck supple.  Cardiovascular: RRR, No murmurs, rubs or gallops. Intact distal pulses.  Pulmonary/Chest: BS equal bilaterally. No respiratory distress. No wheezes, rales or chest tenderness.   Abdominal: Bowel sounds present and normal. Abdomen soft, no tenderness, no rebound and no guarding.  Back: No midline spinal tenderness, no paraspinal tenderness, no CVA tenderness.           Musculoskeletal: No edema, tenderness or deformity.  Skin: warm and dry. No rash, erythema, pallor or cyanosis  Psychiatric: normal mood and affect. Behavior is normal.   Neurological: Patient keenly alert and responsive, easily able to raise eyebrows, facial muscles/expressions symmetric, speaking in fluent sentences, moving all extremities equally and fully, normal gait  Patient Data     Labs Ordered/Reviewed    COMPREHENSIVE METABOLIC PANEL, NON-FASTING - Abnormal; Notable for the following components:       Result Value    GLUCOSE 273 (*)     ALBUMIN/GLOBULIN RATIO 1.6 (*)     CALCIUM, CORRECTED 8.6 (*)     All other components within normal limits    Narrative:     Estimated Glomerular Filtration Rate (eGFR) is calculated using the CKD-EPI (2021) equation, intended for patients 72 years of age and older. If gender is not documented or "unknown", there will be no eGFR calculation.     MAGNESIUM - Normal   CBC WITH DIFF - Normal   CBC/DIFF    Narrative:     The following orders were created for panel order CBC/DIFF.  Procedure                               Abnormality         Status                     ---------                               -----------         ------  CBC WITH ONGE[[id number]]                Normal              Final result                 Please view results for these tests on the individual orders.   EXTRA TUBES    Narrative:     The following orders were created for panel order EXTRA TUBES.  Procedure                               Abnormality         Status                     ---------                               -----------         ------                     Dineen Francois MWNU[[id number]]                                    In process                 GOLD TOP IHKV[[id number]]                                    In process                 LIGHT GREEN TOP FIEP[[id number]]                             In process                 GRAY TOP YSAY[[id number]]                                    In process                   Please view results for these tests on the individual orders.   BLUE TOP TUBE   GOLD TOP TUBE   LIGHT GREEN TOP TUBE   GRAY TOP TUBE     CT BRAIN WO IV CONTRAST   Final Result by Edi, Radresults In (05/02 2036)   NO ACUTE FINDINGS IN THE BRAIN               Radiologist location ID: [id number]           Medical Decision Making        Medical Decision Making  The patient is into  the ED with headaches.  He describes the headaches as frontal that do not radiate.  Denies dizziness, nausea, vomiting.  Labs and CT were obtained.  Labs unremarkable.  CT head shows scattered sinusitis.  The patient will be discharged home with prescription for amoxicillin  and prednisone .                   Clinical Impression  Sinusitis, unspecified chronicity, unspecified location (Primary)   Frequent headaches       Disposition: Discharged

## 2023-10-10 NOTE — ED Triage Notes (Signed)
 INTERMITTENT HA'S AND LIGHTHEADEDNESS FOR OVER A MONTH. STATES NO HA UPON ARRIVAL BUT ONE 'IS STARTING.'

## 2023-10-11 DIAGNOSIS — I252 Old myocardial infarction: Secondary | ICD-10-CM

## 2023-10-11 DIAGNOSIS — R9431 Abnormal electrocardiogram [ECG] [EKG]: Secondary | ICD-10-CM

## 2023-10-11 DIAGNOSIS — I498 Other specified cardiac arrhythmias: Secondary | ICD-10-CM

## 2023-10-11 LAB — ECG 12 LEAD
Atrial Rate: 60 {beats}/min
Calculated P Axis: 11 degrees
Calculated R Axis: -3 degrees
Calculated T Axis: 26 degrees
PR Interval: 158 ms
QRS Duration: 90 ms
QT Interval: 414 ms
QTC Calculation: 414 ms
Ventricular rate: 60 {beats}/min

## 2023-10-11 LAB — LIGHT GREEN TOP TUBE

## 2023-10-11 LAB — GOLD TOP TUBE

## 2023-10-29 ENCOUNTER — Ambulatory Visit
Admission: RE | Admit: 2023-10-29 | Discharge: 2023-10-29 | Disposition: A | Discharge status: Home / Self Care | Payer: Self-pay | Source: Ambulatory Visit | Attending: GENERAL SURGERY | Admitting: GENERAL SURGERY

## 2023-10-29 ENCOUNTER — Inpatient Hospital Stay (HOSPITAL_BASED_OUTPATIENT_CLINIC_OR_DEPARTMENT_OTHER)
Admission: RE | Admit: 2023-10-29 | Discharge: 2023-10-29 | Disposition: A | Discharge status: Home / Self Care | Payer: Self-pay | Source: Ambulatory Visit | Attending: GENERAL SURGERY | Admitting: GENERAL SURGERY

## 2023-10-29 ENCOUNTER — Other Ambulatory Visit: Payer: Self-pay

## 2023-10-29 DIAGNOSIS — G319 Degenerative disease of nervous system, unspecified: Secondary | ICD-10-CM

## 2023-10-29 DIAGNOSIS — I6782 Cerebral ischemia: Secondary | ICD-10-CM

## 2023-10-29 DIAGNOSIS — R42 Dizziness and giddiness: Secondary | ICD-10-CM
